# Patient Record
Sex: Male | Born: 1975
Health system: Southern US, Community
[De-identification: ages and names within clinical notes are randomized; demographics above are authoritative.]

---

## 2010-04-06 ENCOUNTER — Ambulatory Visit (HOSPITAL_BASED_OUTPATIENT_CLINIC_OR_DEPARTMENT_OTHER): Admission: RE | Admit: 2010-04-06 | Discharge: 2010-04-06 | Payer: Self-pay | Admitting: Family Medicine

## 2010-04-06 ENCOUNTER — Ambulatory Visit: Payer: Self-pay | Admitting: Family Medicine

## 2010-04-06 ENCOUNTER — Ambulatory Visit: Payer: Self-pay | Admitting: Diagnostic Radiology

## 2010-04-06 DIAGNOSIS — M79609 Pain in unspecified limb: Secondary | ICD-10-CM

## 2010-05-15 ENCOUNTER — Ambulatory Visit: Payer: Self-pay | Admitting: Internal Medicine

## 2010-05-15 DIAGNOSIS — J019 Acute sinusitis, unspecified: Secondary | ICD-10-CM

## 2010-05-18 ENCOUNTER — Encounter: Payer: Self-pay | Admitting: Internal Medicine

## 2010-05-18 LAB — CONVERTED CEMR LAB
AST: 13 units/L (ref 0–37)
Albumin: 4.5 g/dL (ref 3.5–5.2)
CO2: 30 meq/L (ref 19–32)
Calcium: 9.1 mg/dL (ref 8.4–10.5)
Chloride: 103 meq/L (ref 96–112)
HDL: 41 mg/dL (ref >39)
LDL Cholesterol: 116 mg/dL — ABNORMAL HIGH (ref 0–99)
Sodium: 142 meq/L (ref 135–145)
Total Bilirubin: 1 mg/dL (ref 0.3–1.2)
Total CHOL/HDL Ratio: 4.4

## 2010-05-19 ENCOUNTER — Encounter: Payer: Self-pay | Admitting: Internal Medicine

## 2010-08-01 NOTE — Assessment & Plan Note (Signed)
Summary: NP INDEX FINGER PAIN - DOI MAY 2011/MJD   Vital Signs:  Patient profile:   35 year old male Height:      68 inches Weight:      159.8 pounds BMI:     24.39 Pulse rate:   82 / minute BP sitting:   127 / 84  History of Present Illness: 35 yo M here for right index finger pain.  Patient reports in May 2011 he tripped and fell while running, sustaining an injury to his right index finger He had swelling and bruising especially over dorsal aspect DIP from where he indicates. Saw a physician at [facility], had x-rays and told he had a fracture Was told to wear a splint for about 4 weeks then take it off. He did so but continues to have swelling and pain centered at DIP. 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 Is right handed.  Problems Prior to Update: 1)  Finger Pain  (ICD-729.5)  Medications Prior to Update: 1)  None  Allergies (verified): No Known Drug Allergies  Family History: negative for DM, heart disease, HTN  Social History: nonsmoker.  Drinks about 1 bottle a month.  Works for [employer].  Physical Exam  General:  Well-developed,well-nourished,in no acute distress; alert,appropriate and cooperative throughout examination Msk:  R 2nd finger: Swelling and ? callus at DIP dorsally. Mild TTP over this area. Minimal swan neck deformity.  No boutonniere deformity. FROM - able to resist flexion and extension at both DIP and PIP joints. NVI distally. Collateral ligaments intact.   Impression & Recommendations:  Problem # 1:  FINGER PAIN (ICD-729.5) Assessment New Suspect a mallet finger based on his history and exam.  ? if he had a fracture before - does have bony prominence dorsally.  X-rays negative.  Able to flex and extend at both PIP and DIP.  He only worse splint for 4 weeks to a month so does not sound like this was completely treated.  His pain may alternatively be related to residual stiffness.  Regardless will treat this conservatively with 6  weeks of a dorsal aluminum splint taped to keep DIP in extension.  Showed him how to clean and take care of area while maintaining extension.  Aleve twice a day OR voltaren gel - given sample to try.  Ice as needed. F/u in 6 weeks for recheck and at that time anticipate starting exercises either in rehab or home program.  Discussed he will almost certainly have a prominence dorsally for the rest of his life over this area.  Orders: Diagnostic X-Ray/Fluoroscopy (Diagnostic X-Ray/Flu)  Patient Instructions: 1)  You have a mallet finger. 2)  Treatment for this involves splinting the joint for 6 weeks.   3)  It's VERY important that you do not flex your finger where splinted during this time. 4)  Keep finger on a flat surface when removing the splint when you take it off to wash it. 5)  Take aleve 2 tabs twice a day with food. 6)  OR you can try the topical anti-inflammatory - if this works for you, call me and I can send in a prescription (some insurances won't cover this though). 7)  Follow up with me in 6 weeks for a recheck - at that time we will start you on some exercises to get the stiffness out of this.

## 2010-08-01 NOTE — Letter (Signed)
     [place] [facility] [address] [place], [place]  [address]  [place] Phone: [phone]      May 19, 2010   [name] [address] [place], [place] [address]  RE:  LAB RESULTS  Dear  [name],  The following is an interpretation of your most recent lab tests.  Please take note of any instructions provided or changes to medications that have resulted from your lab work.  ELECTROLYTES:  Good - no changes needed  KIDNEY FUNCTION TESTS:  Good - no changes needed  LIVER FUNCTION TESTS:  Good - no changes needed  LIPID PANEL:  Good - no changes needed Triglyceride: 121   Cholesterol: 181   LDL: 116   HDL: 41   Chol/HDL%:  4.4 Ratio  Blood sugar - normal         Sincerely Yours,    Dr. Thomos Lemons  Appended Document:  Mailed.

## 2010-08-01 NOTE — Assessment & Plan Note (Signed)
Summary: new to est bcbs/mhf   Vital Signs:  Patient profile:   35 year old male Height:      68 inches Weight:      157.75 pounds BMI:     24.07 O2 Sat:      98 % on Room air Temp:     98.3 degrees F oral Pulse rate:   80 / minute Resp:     16 per minute BP sitting:   114 / 80  (right arm) Cuff size:   regular  Vitals Entered By: Glendell Docker CMA (May 15, 2010 1:46 PM)  O2 Flow:  Room air CC: New Patient  Is Patient Diabetic? No Pain Assessment Patient in pain? no      Comments c/o throat irritation, non productive cough, no pain with swallowing, taken over the counter products with no relief   Primary Care [place]:  Dondra Spry DO  CC:  New Patient .  History of Present Illness: 35 year old [place] male to establish He complains of upper respiratory symptoms onset 1 week,   fever last week,  sore throat residual itchy throat,  cough worse at night non productive - white sputum   remote hx kidney problem   Preventive Screening-Counseling & Management  Alcohol-Tobacco     Alcohol drinks/day: <1     Smoking Status: never  Caffeine-Diet-Exercise     Caffeine use/day: None     Does Patient Exercise: no  Allergies (verified): No Known Drug Allergies  Past History:  Past Surgical History: left wrist surgery 2005   Family History: negative for DM, heart disease, HTN parents still living - OA, mild asthma no colon cancer   moved from [place]. [place] in 2007  Social History: nonsmoker.  Drinks about 1 bottle a month.  Works for [employer]. Occupation: Art gallery manager Married 4 years 1 daughter 3 yrs  moved from [place]  Status:  never Caffeine use/day:  None Does Patient Exercise:  no  Review of Systems  The patient denies anorexia, fever, weight gain, chest pain, dyspnea on exertion, prolonged cough, abdominal pain, melena, hematochezia, and severe indigestion/heartburn.    Physical Exam  General:  alert, well-developed, and well-nourished.   Head:   normocephalic and atraumatic.   Ears:  R ear normal and L ear normal.   Mouth:  pharyngeal erythema.   Neck:  supple and no masses.   Lungs:  normal respiratory effort, normal breath sounds, no crackles, and no wheezes.   Heart:  normal rate, regular rhythm, no murmur, and no gallop.   Abdomen:  soft, non-tender, and normal bowel sounds.   Extremities:  No lower extremity edema  Neurologic:  cranial nerves II-XII intact and gait normal.   Psych:  normally interactive, good eye contact, not anxious appearing, and not depressed appearing.     Impression & Recommendations:  Problem # 1:  SINUSITIS- ACUTE-NOS (ICD-461.9)  His updated medication list for this problem includes:    Cefuroxime Axetil 500 Mg Tabs (Cefuroxime axetil) ..... One by mouth two times a day    Hydrocodone-homatropine 5-1.5 Mg/74ml Syrp (Hydrocodone-homatropine) .Marland KitchenMarland KitchenMarland KitchenMarland Kitchen 5 ml by mouth at bedtime as needed for cough  Instructed on treatment. Call if symptoms persist or worsen.   Complete Medication List: 1)  Cefuroxime Axetil 500 Mg Tabs (Cefuroxime axetil) .... One by mouth two times a day 2)  Hydrocodone-homatropine 5-1.5 Mg/28ml Syrp (Hydrocodone-homatropine) .... 5 ml by mouth at bedtime as needed for cough  Other Orders: T-Basic Metabolic Panel [id number]) T-Lipid Profile [id number]) T-Hepatic Function (  [phone])  Patient Instructions: 1)  Call our office if your symptoms do not  improve or gets worse. Prescriptions: HYDROCODONE-HOMATROPINE 5-1.5 MG/5ML SYRP (HYDROCODONE-HOMATROPINE) 5 ml by mouth at bedtime as needed for cough  #60 ml x 0   Entered and Authorized by:   D. Thomos Lemons DO   Signed by:   D. Thomos Lemons DO on 05/15/2010   Method used:   Print then Give to Patient   RxID:   [id number] CEFUROXIME AXETIL 500 MG TABS (CEFUROXIME AXETIL) one by mouth two times a day  #14 x 0   Entered and Authorized by:   D. Thomos Lemons DO   Signed by:   D. Thomos Lemons DO on 05/15/2010   Method used:    Electronically to        [facility] [phone]* (retail)       [address]       [place], [place]  [address]       Ph: [phone]       Fax: [phone]   RxID:   [id number]    Orders Added: 1)  T-Basic Metabolic Panel [id number] 2)  T-Lipid Profile [id number] 3)  T-Hepatic Function [id number] 4)  New Patient Level III [[address]]   Immunization History:  Influenza Immunization History:    Influenza:  declined (05/15/2010)   Contraindications/Deferment of Procedures/Staging:    Test/Procedure: FLU VAX    Reason for deferment: patient declined   Immunization History:  Influenza Immunization History:    Influenza:  Declined (05/15/2010)  Current Allergies (reviewed today): No known allergies

## 2010-09-14 ENCOUNTER — Encounter: Payer: Self-pay | Admitting: Internal Medicine

## 2010-09-14 ENCOUNTER — Ambulatory Visit (INDEPENDENT_AMBULATORY_CARE_PROVIDER_SITE_OTHER): Payer: BC Managed Care – PPO | Admitting: Internal Medicine

## 2010-09-14 DIAGNOSIS — R070 Pain in throat: Secondary | ICD-10-CM

## 2010-09-14 DIAGNOSIS — J069 Acute upper respiratory infection, unspecified: Secondary | ICD-10-CM | POA: Insufficient documentation

## 2010-09-14 LAB — CONVERTED CEMR LAB: Rapid Strep: NEGATIVE

## 2010-09-22 ENCOUNTER — Encounter: Payer: BC Managed Care – PPO | Admitting: Internal Medicine

## 2010-09-28 ENCOUNTER — Encounter (INDEPENDENT_AMBULATORY_CARE_PROVIDER_SITE_OTHER): Payer: Self-pay | Admitting: *Deleted

## 2010-10-03 NOTE — Assessment & Plan Note (Signed)
Summary: Sore throat/hea   Vital Signs:  Patient profile:   35 year old male Height:      68 inches Weight:      163 pounds BMI:     24.87 O2 Sat:      97 % on Room air Temp:     98.6 degrees F oral Pulse rate:   73 / minute Resp:     16 per minute BP sitting:   110 / 70  (right arm) Cuff size:   regular  Vitals Entered By: Glendell Docker CMA (September 14, 2010 1:54 PM)  O2 Flow:  Room air CC: Throat Pain Is Patient Diabetic? No Pain Assessment Patient in pain? no      Comments c/o throat pain, worse at night, non productive cough, clearing of throat constantly onset Monday, tylenol taken with some relief, evaluatiion of right index finger-pain unresolved from injury last year   Primary Care Provider:  Dondra Spry DO  CC:  Throat Pain.  History of Present Illness: sore throat x 4 days mild chills yest laryngitis daughter had URI last week   Preventive Screening-Counseling & Management  Alcohol-Tobacco     Smoking Status: never  Allergies (verified): No Known Drug Allergies  Past History:  Past Surgical History: left wrist surgery 2005    Family History: negative for DM, heart disease, HTN parents still living - OA, mild asthma no colon cancer   moved from [place]. [place] in 2007   Social History: nonsmoker.  Drinks about 1 bottle a month.  Works for [employer]. 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   Ears:  R ear normal and L ear normal.   Mouth:  pharyngeal erythema.   Neck:  supple and no masses.   Lungs:  normal respiratory effort, normal breath sounds, no crackles, and no wheezes.   Heart:  normal rate, regular rhythm, no murmur, and no gallop.     Impression & Recommendations:  Problem # 1:  URI (ICD-465.9)  His updated medication list for this problem includes:    Hydrocodone-homatropine 5-1.5 Mg/60ml Syrp (Hydrocodone-homatropine) .Marland KitchenMarland KitchenMarland KitchenMarland Kitchen 5 ml by  mouth at bedtime as needed for cough  Instructed on symptomatic treatment. Call if symptoms persist or worsen.   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   refer orhto for further eval and tx  Orders: Orthopedic Referral (Ortho)  Complete Medication List: 1)  Hydrocodone-homatropine 5-1.5 Mg/45ml Syrp (Hydrocodone-homatropine) .... 5 ml by mouth at bedtime as needed for cough  Other Orders: Rapid Strep ([id number])  Patient Instructions: 1)  Use mucinex DM for cough 2)  Gargle with warm salt water three times a day 3)  use ibuprofen or tylenol as needed 4)  Call our office if your symptoms do not  improve or gets worse. 5)  Our office will contact you re: referral to hand specialist   Orders Added: 1)  Rapid Strep [[id number]] 2)  Orthopedic Referral [Ortho]    Current Allergies (reviewed today): No known allergies   Laboratory Results    Other Tests  Rapid Strep: negative

## 2010-10-03 NOTE — Letter (Signed)
Summary: Primary Care Consult Scheduled Letter  [place] [facility]  [address]   [place], [place] [address]   Phone: [phone]  Fax: [phone]      09/28/2010 MRN: [id number]  Philip Ramirez [address] [place], [place]  [address]  [place]    Dear Mr. MCNEEL,      We have scheduled an appointment for you.  At the recommendation of Dr. Artist Pais, we have scheduled you a consult with [facility] , DR Teressa Senter on APRIL 9,2012 at 9:30AM.  Their address [address]  [place]  [address]. The office phone number is [phone].  If this appointment day and time is not convenient for you, please feel free to call the office of the doctor you are being referred to at the number listed above and reschedule the appointment.     It is important for you to keep your scheduled appointments. We are here to make sure you are given good patient care.     Thank you,  Darral Dash Patient Care Coordinator [place] [facility]

## 2011-07-10 IMAGING — CR DG FINGER INDEX 2+V*R*
3 series · 3 of 3 positions shown · non-contrast
Comparison: None

CLINICAL DATA: Fell.  Right index finger.

RIGHT INDEX FINGER 2+V

[x finger pa right]
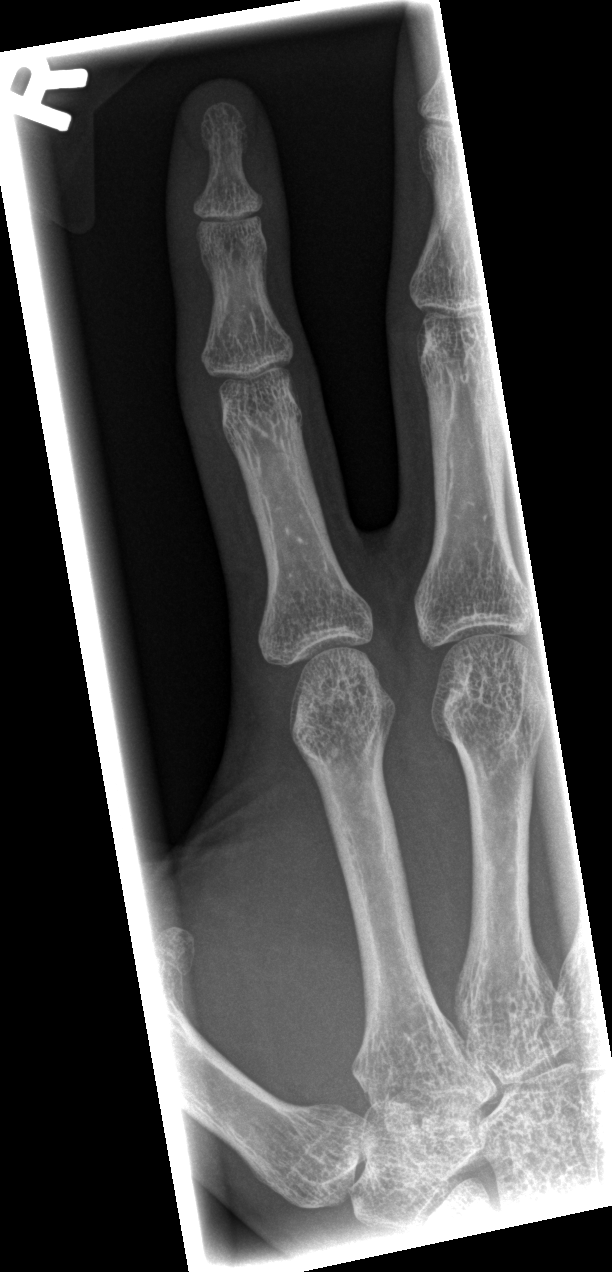

[x finger obl. right]
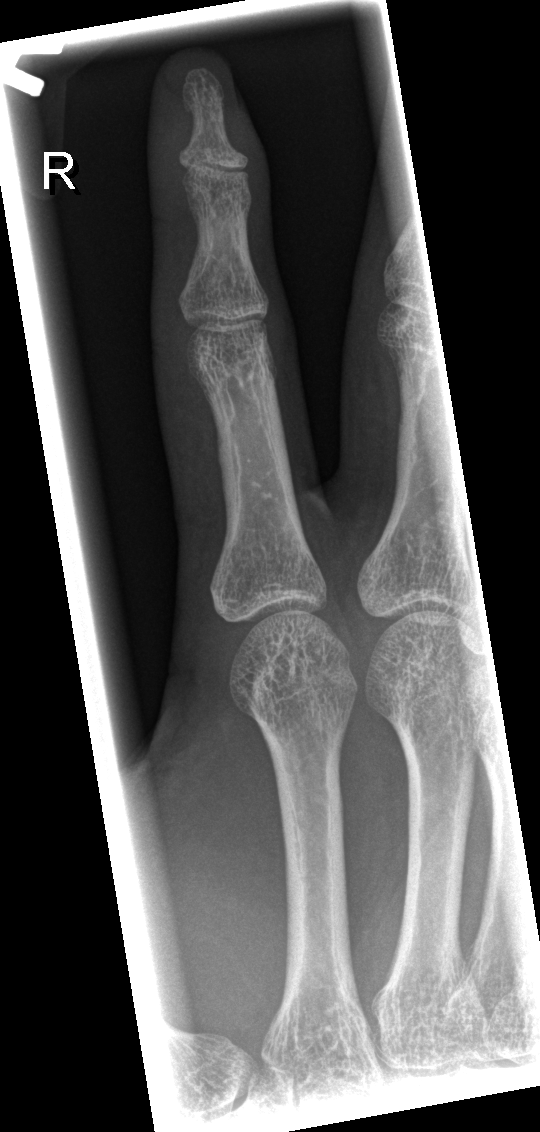

[x finger lateral right]
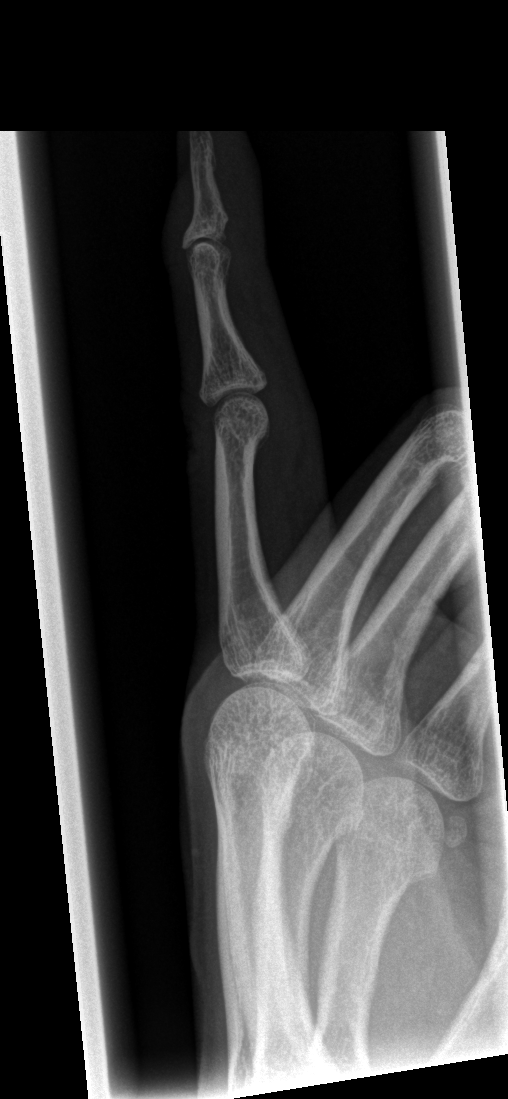

[3 of 3 positions shown; findings below may reference images not displayed]

FINDINGS: The joint spaces are maintained.  No fractures are seen.
IMPRESSION: No acute bony findings.

## 2016-10-26 DIAGNOSIS — R195 Other fecal abnormalities: Secondary | ICD-10-CM | POA: Diagnosis not present

## 2016-10-26 DIAGNOSIS — E781 Pure hyperglyceridemia: Secondary | ICD-10-CM | POA: Diagnosis not present

## 2016-10-26 DIAGNOSIS — E039 Hypothyroidism, unspecified: Secondary | ICD-10-CM | POA: Diagnosis not present

## 2016-11-02 DIAGNOSIS — E781 Pure hyperglyceridemia: Secondary | ICD-10-CM | POA: Diagnosis not present

## 2016-11-02 DIAGNOSIS — L309 Dermatitis, unspecified: Secondary | ICD-10-CM | POA: Diagnosis not present

## 2016-11-02 DIAGNOSIS — E039 Hypothyroidism, unspecified: Secondary | ICD-10-CM | POA: Diagnosis not present

## 2017-06-26 DIAGNOSIS — E039 Hypothyroidism, unspecified: Secondary | ICD-10-CM | POA: Diagnosis not present

## 2017-06-26 DIAGNOSIS — Z0001 Encounter for general adult medical examination with abnormal findings: Secondary | ICD-10-CM | POA: Diagnosis not present

## 2017-06-26 DIAGNOSIS — E781 Pure hyperglyceridemia: Secondary | ICD-10-CM | POA: Diagnosis not present

## 2017-06-26 DIAGNOSIS — Z125 Encounter for screening for malignant neoplasm of prostate: Secondary | ICD-10-CM | POA: Diagnosis not present

## 2017-07-01 DIAGNOSIS — E7800 Pure hypercholesterolemia, unspecified: Secondary | ICD-10-CM | POA: Diagnosis not present

## 2017-07-01 DIAGNOSIS — Z0000 Encounter for general adult medical examination without abnormal findings: Secondary | ICD-10-CM | POA: Diagnosis not present

## 2017-07-01 DIAGNOSIS — E039 Hypothyroidism, unspecified: Secondary | ICD-10-CM | POA: Diagnosis not present

## 2017-07-01 DIAGNOSIS — Z23 Encounter for immunization: Secondary | ICD-10-CM | POA: Diagnosis not present

## 2017-08-05 DIAGNOSIS — J069 Acute upper respiratory infection, unspecified: Secondary | ICD-10-CM | POA: Diagnosis not present

## 2017-12-20 DIAGNOSIS — E039 Hypothyroidism, unspecified: Secondary | ICD-10-CM | POA: Diagnosis not present

## 2017-12-20 DIAGNOSIS — E7800 Pure hypercholesterolemia, unspecified: Secondary | ICD-10-CM | POA: Diagnosis not present

## 2017-12-27 DIAGNOSIS — E039 Hypothyroidism, unspecified: Secondary | ICD-10-CM | POA: Diagnosis not present

## 2017-12-27 DIAGNOSIS — E781 Pure hyperglyceridemia: Secondary | ICD-10-CM | POA: Diagnosis not present

## 2019-06-30 ENCOUNTER — Ambulatory Visit: Payer: 59 | Attending: Internal Medicine

## 2019-06-30 DIAGNOSIS — Z20822 Contact with and (suspected) exposure to covid-19: Secondary | ICD-10-CM

## 2019-07-02 LAB — NOVEL CORONAVIRUS, NAA: SARS-CoV-2, NAA: NOT DETECTED

## 2020-05-15 ENCOUNTER — Ambulatory Visit: Payer: 59 | Attending: Internal Medicine

## 2020-05-15 DIAGNOSIS — Z23 Encounter for immunization: Secondary | ICD-10-CM

## 2020-05-15 NOTE — Progress Notes (Signed)
   Covid-19 Vaccination Clinic  Name:  Gabe Glace    MRN: [id number] DOB: [birth year]  05/15/2020  Mr. Kozma was observed post Covid-19 immunization for 15 minutes without incident. He was provided with Vaccine Information Sheet and instruction to access the V-Safe system.   Mr. Benninger was instructed to call 911 with any severe reactions post vaccine: Marland Kitchen Difficulty breathing  . Swelling of face and throat  . A fast heartbeat  . A bad rash all over body  . Dizziness and weakness   Immunizations Administered    Name Date Dose VIS Date Route   Pfizer COVID-19 Vaccine 05/15/2020 10:11 AM 0.3 mL 04/20/2020 Intramuscular   Manufacturer: [employer], [employer]   Lot: [id number]   NDC: [address]-8

## 2022-05-21 ENCOUNTER — Other Ambulatory Visit (HOSPITAL_COMMUNITY): Payer: Self-pay

## 2022-05-21 DIAGNOSIS — E783 Hyperchylomicronemia: Secondary | ICD-10-CM

## 2022-05-25 ENCOUNTER — Encounter (HOSPITAL_COMMUNITY): Payer: Self-pay

## 2022-05-25 ENCOUNTER — Inpatient Hospital Stay (HOSPITAL_COMMUNITY): Admission: RE | Admit: 2022-05-25 | Payer: 59 | Source: Ambulatory Visit

## 2022-06-08 ENCOUNTER — Ambulatory Visit (HOSPITAL_COMMUNITY)
Admission: RE | Admit: 2022-06-08 | Discharge: 2022-06-08 | Disposition: A | Discharge status: Home / Self Care | Payer: 59 | Source: Ambulatory Visit

## 2022-06-08 DIAGNOSIS — E783 Hyperchylomicronemia: Secondary | ICD-10-CM | POA: Insufficient documentation
